# Patient Record
Sex: Male | Born: 1940 | Race: Black or African American | Hispanic: No | Marital: Single
Health system: Southern US, Community
[De-identification: ages and names within clinical notes are randomized; demographics above are authoritative.]

## PROBLEM LIST (undated history)

## (undated) DIAGNOSIS — F0390 Unspecified dementia without behavioral disturbance: Secondary | ICD-10-CM

## (undated) DIAGNOSIS — I10 Essential (primary) hypertension: Secondary | ICD-10-CM

---

## 2013-09-05 ENCOUNTER — Emergency Department (HOSPITAL_COMMUNITY)
Admission: EM | Admit: 2013-09-05 | Discharge: 2013-09-05 | Disposition: A | Discharge status: Home / Self Care | Payer: PRIVATE HEALTH INSURANCE | Attending: Emergency Medicine | Admitting: Emergency Medicine

## 2013-09-05 ENCOUNTER — Emergency Department (HOSPITAL_COMMUNITY): Payer: PRIVATE HEALTH INSURANCE

## 2013-09-05 ENCOUNTER — Encounter (HOSPITAL_COMMUNITY): Payer: Self-pay | Admitting: Emergency Medicine

## 2013-09-05 DIAGNOSIS — S31801A Laceration without foreign body of unspecified buttock, initial encounter: Secondary | ICD-10-CM

## 2013-09-05 DIAGNOSIS — Z79899 Other long term (current) drug therapy: Secondary | ICD-10-CM | POA: Insufficient documentation

## 2013-09-05 DIAGNOSIS — S31809A Unspecified open wound of unspecified buttock, initial encounter: Secondary | ICD-10-CM | POA: Insufficient documentation

## 2013-09-05 DIAGNOSIS — Z23 Encounter for immunization: Secondary | ICD-10-CM | POA: Insufficient documentation

## 2013-09-05 DIAGNOSIS — I10 Essential (primary) hypertension: Secondary | ICD-10-CM | POA: Insufficient documentation

## 2013-09-05 DIAGNOSIS — F0390 Unspecified dementia without behavioral disturbance: Secondary | ICD-10-CM | POA: Insufficient documentation

## 2013-09-05 HISTORY — DX: Unspecified dementia, unspecified severity, without behavioral disturbance, psychotic disturbance, mood disturbance, and anxiety: F03.90

## 2013-09-05 HISTORY — DX: Essential (primary) hypertension: I10

## 2013-09-05 MED ORDER — MIDAZOLAM HCL 2 MG/2ML IJ SOLN
INTRAMUSCULAR | Status: AC
  Filled 2013-09-05: qty 2

## 2013-09-05 MED ORDER — HALOPERIDOL LACTATE 5 MG/ML IJ SOLN
INTRAMUSCULAR | Status: AC
  Filled 2013-09-05: qty 1

## 2013-09-05 MED ORDER — MIDAZOLAM HCL 5 MG/5ML IJ SOLN
2.0000 mg | Freq: Once | INTRAMUSCULAR | Status: AC
  Administered 2013-09-05: 2 mg via INTRAMUSCULAR

## 2013-09-05 MED ORDER — HALOPERIDOL LACTATE 5 MG/ML IJ SOLN
5.0000 mg | Freq: Once | INTRAMUSCULAR | Status: AC
  Administered 2013-09-05: 5 mg via INTRAMUSCULAR

## 2013-09-05 MED ORDER — TETANUS-DIPHTH-ACELL PERTUSSIS 5-2.5-18.5 LF-MCG/0.5 IM SUSP
0.5000 mL | Freq: Once | INTRAMUSCULAR | Status: AC
Start: 2013-09-05 — End: 2013-09-05
  Administered 2013-09-05: 0.5 mL via INTRAMUSCULAR
  Filled 2013-09-05: qty 0.5

## 2013-09-05 NOTE — Discharge Instructions (Signed)
Laceration Care, Adult °A laceration is a cut or lesion that goes through all layers of the skin and into the tissue just beneath the skin. °TREATMENT  °Some lacerations may not require closure. Some lacerations may not be able to be closed due to an increased risk of infection. It is important to see your caregiver as soon as possible after an injury to minimize the risk of infection and maximize the opportunity for successful closure. °If closure is appropriate, pain medicines may be given, if needed. The wound will be cleaned to help prevent infection. Your caregiver will use stitches (sutures), staples, wound glue (adhesive), or skin adhesive strips to repair the laceration. These tools bring the skin edges together to allow for faster healing and a better cosmetic outcome. However, all wounds will heal with a scar. Once the wound has healed, scarring can be minimized by covering the wound with sunscreen during the day for 1 full year. °HOME CARE INSTRUCTIONS  °For sutures or staples: °· Keep the wound clean and dry. °· If you were given a bandage (dressing), you should change it at least once a day. Also, change the dressing if it becomes wet or dirty, or as directed by your caregiver. °· Wash the wound with soap and water 2 times a day. Rinse the wound off with water to remove all soap. Pat the wound dry with a clean towel. °· After cleaning, apply a thin layer of the antibiotic ointment as recommended by your caregiver. This will help prevent infection and keep the dressing from sticking. °· You may shower as usual after the first 24 hours. Do not soak the wound in water until the sutures are removed. °· Only take over-the-counter or prescription medicines for pain, discomfort, or fever as directed by your caregiver. °· Get your sutures or staples removed as directed by your caregiver. °For skin adhesive strips: °· Keep the wound clean and dry. °· Do not get the skin adhesive strips wet. You may bathe  carefully, using caution to keep the wound dry. °· If the wound gets wet, pat it dry with a clean towel. °· Skin adhesive strips will fall off on their own. You may trim the strips as the wound heals. Do not remove skin adhesive strips that are still stuck to the wound. They will fall off in time. °For wound adhesive: °· You may briefly wet your wound in the shower or bath. Do not soak or scrub the wound. Do not swim. Avoid periods of heavy perspiration until the skin adhesive has fallen off on its own. After showering or bathing, gently pat the wound dry with a clean towel. °· Do not apply liquid medicine, cream medicine, or ointment medicine to your wound while the skin adhesive is in place. This may loosen the film before your wound is healed. °· If a dressing is placed over the wound, be careful not to apply tape directly over the skin adhesive. This may cause the adhesive to be pulled off before the wound is healed. °· Avoid prolonged exposure to sunlight or tanning lamps while the skin adhesive is in place. Exposure to ultraviolet light in the first year will darken the scar. °· The skin adhesive will usually remain in place for 5 to 10 days, then naturally fall off the skin. Do not pick at the adhesive film. °You may need a tetanus shot if: °· You cannot remember when you had your last tetanus shot. °· You have never had a tetanus   shot. If you get a tetanus shot, your arm may swell, get red, and feel warm to the touch. This is common and not a problem. If you need a tetanus shot and you choose not to have one, there is a rare chance of getting tetanus. Sickness from tetanus can be serious. SEEK MEDICAL CARE IF:   You have redness, swelling, or increasing pain in the wound.  You see a red line that goes away from the wound.  You have yellowish-white fluid (pus) coming from the wound.  You have a fever.  You notice a bad smell coming from the wound or dressing.  Your wound breaks open before or  after sutures have been removed.  You notice something coming out of the wound such as wood or glass.  Your wound is on your hand or foot and you cannot move a finger or toe. SEEK IMMEDIATE MEDICAL CARE IF:   Your pain is not controlled with prescribed medicine.  You have severe swelling around the wound causing pain and numbness or a change in color in your arm, hand, leg, or foot.  Your wound splits open and starts bleeding.  You have worsening numbness, weakness, or loss of function of any joint around or beyond the wound.  You develop painful lumps near the wound or on the skin anywhere on your body. MAKE SURE YOU:   Understand these instructions.  Will watch your condition.  Will get help right away if you are not doing well or get worse. Document Released: 06/09/2005 Document Revised: 09/01/2011 Document Reviewed: 12/03/2010 [facility] Patient Information 2014 [place], [place]. Staple Wound Closure Staples are used to help a wound heal faster by holding the edges of the wound together. HOME CARE  Keep the area around the staples clean and dry.  Rest and raise (elevate) the injured part above the level of your heart.  See your doctor for a follow-up check of the wound.  See your doctor to have the staples removed.  Clean the wound daily with water.  Do not soak the wound in water for long periods of time.  Let air reach the wound as it heals. GET HELP RIGHT AWAY IF:   You have redness or puffiness around the wound.  You have a red line going away from the wound.  You have more pain or tenderness.  You have yellowish-white fluid (pus) coming from the wound.  Your wound does not stay together after the staples have been taken out.  You see something coming out of the wound, such as wood or glass.  You have problems moving the injured area.  You have a fever or lasting symptoms for more than 2-3 days.  You have a fever and your symptoms suddenly get  worse. MAKE SURE YOU:   Understand these instructions.  Will watch this condition.  Will get help right away if you are not doing well or get worse. Document Released: 03/18/2008 Document Revised: 03/03/2012 Document Reviewed: 12/21/2011 [facility] Patient Information 2014 [place], [place].
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
## 2013-09-05 NOTE — ED Notes (Signed)
Bed: WA02 Expected date:  Expected time:  Means of arrival:  Comments: EMS fall lac on [place]

## 2013-09-05 NOTE — Progress Notes (Signed)
CSW engaged with the patient and he was unable to provide accurate information about his living arrangements.  Documentation [facility] and the patient denies.  CSW confirmed with the RN that the patient is [facility] for [facility].      Maryelizabeth Rowanressa Emerson Schreifels, MSW, SidneyLCSWA, 09/05/2013  Evening Clinical Social Worker [phone]
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
## 2013-09-16 ENCOUNTER — Encounter (HOSPITAL_COMMUNITY): Payer: Self-pay | Admitting: Emergency Medicine

## 2013-09-16 ENCOUNTER — Emergency Department (HOSPITAL_COMMUNITY)
Admission: EM | Admit: 2013-09-16 | Discharge: 2013-09-16 | Disposition: A | Discharge status: Home / Self Care | Payer: PRIVATE HEALTH INSURANCE | Attending: Emergency Medicine | Admitting: Emergency Medicine

## 2013-09-16 DIAGNOSIS — Z4802 Encounter for removal of sutures: Secondary | ICD-10-CM | POA: Insufficient documentation

## 2013-09-16 DIAGNOSIS — I10 Essential (primary) hypertension: Secondary | ICD-10-CM | POA: Insufficient documentation

## 2013-09-16 DIAGNOSIS — Z79899 Other long term (current) drug therapy: Secondary | ICD-10-CM | POA: Insufficient documentation

## 2013-09-16 DIAGNOSIS — F0390 Unspecified dementia without behavioral disturbance: Secondary | ICD-10-CM | POA: Insufficient documentation

## 2013-09-16 NOTE — Discharge Instructions (Signed)
Read the information below.  You may return to the Emergency Department at any time for worsening condition or any new symptoms that concern you.  Keep the cuts clean and covered with a thin layer of antibiotic ointment and a bandage until they are healed.  Change the bandage twice daily.  If you develop redness, swelling, pus draining from the wound, or fevers greater than 100.4, return to the ER immediately for a recheck.     Staple Removal Care After The staples used to close your skin have been removed. The wound needs continued care so it can heal completely and without problems. The care described here will need to be done for another 5-10 days unless your caregiver advises otherwise.  HOME CARE INSTRUCTIONS   Keep wound site dry and clean.  If skin adhesive strips were applied after the staples were removed, they will begin to peel off in a few days. If they remain after fourteen days, they may be peeled off and discarded.  If you still have a dressing, change it at least once a day or as instructed by your caregiver. If the bandage sticks, soak it off with warm water. Pat dry with a clean towel. Look for signs of infection (see below).  Reapply cream or ointment according to your caregiver's instruction. This will help prevent infection and keep the bandage from sticking. Use of a non-stick material over the wound and under the dressing or wrap will also help keep the bandage from sticking.  If the bandage becomes wet, dirty or develops a foul smell, change it as soon as possible.  New scars become sunburned easily. Use sunscreens with protection factor (SPF) of at least 15 when out in the sun.  Only take over-the-counter or prescription medicines for pain, discomfort or fever as directed by your caregiver. SEEK IMMEDIATE MEDICAL CARE IF:   There is redness, swelling or increasing pain in the wound.  Pus is coming from the wound.  An unexplained oral temperature above 102 F (38.9  C) develops.  You notice a foul smell coming from the wound or dressing.  There is a breaking open of the suture line (edges not staying together) of the wound edges after staples have been removed. Document Released: 05/22/2008 Document Revised: 09/01/2011 Document Reviewed: 05/22/2008 [facility] Patient Information 2014 [place]ExitCare, [place]LL[place].

## 2013-09-16 NOTE — ED Provider Notes (Signed)
  Medical screening examination/treatment/procedure(s) were performed by non-physician practitioner and as supervising physician I was immediately available for consultation/collaboration.   EKG Interpretation None         Gerhard Munchobert Caleesi Kohl, MD 09/16/13 [phone]
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
## 2014-12-02 ENCOUNTER — Emergency Department (HOSPITAL_COMMUNITY)
Admission: EM | Admit: 2014-12-02 | Discharge: 2014-12-02 | Disposition: A | Discharge status: Home / Self Care | Payer: PRIVATE HEALTH INSURANCE | Attending: Emergency Medicine | Admitting: Emergency Medicine

## 2014-12-02 ENCOUNTER — Encounter (HOSPITAL_COMMUNITY): Payer: Self-pay | Admitting: Emergency Medicine

## 2014-12-02 ENCOUNTER — Emergency Department (HOSPITAL_COMMUNITY): Payer: PRIVATE HEALTH INSURANCE

## 2014-12-02 DIAGNOSIS — S4991XA Unspecified injury of right shoulder and upper arm, initial encounter: Secondary | ICD-10-CM | POA: Diagnosis present

## 2014-12-02 DIAGNOSIS — I10 Essential (primary) hypertension: Secondary | ICD-10-CM | POA: Insufficient documentation

## 2014-12-02 DIAGNOSIS — Y9389 Activity, other specified: Secondary | ICD-10-CM | POA: Insufficient documentation

## 2014-12-02 DIAGNOSIS — F0391 Unspecified dementia with behavioral disturbance: Secondary | ICD-10-CM | POA: Diagnosis not present

## 2014-12-02 DIAGNOSIS — M7501 Adhesive capsulitis of right shoulder: Secondary | ICD-10-CM | POA: Insufficient documentation

## 2014-12-02 DIAGNOSIS — Z79899 Other long term (current) drug therapy: Secondary | ICD-10-CM | POA: Insufficient documentation

## 2014-12-02 DIAGNOSIS — Y998 Other external cause status: Secondary | ICD-10-CM | POA: Diagnosis not present

## 2014-12-02 DIAGNOSIS — Z791 Long term (current) use of non-steroidal anti-inflammatories (NSAID): Secondary | ICD-10-CM | POA: Insufficient documentation

## 2014-12-02 DIAGNOSIS — W1839XA Other fall on same level, initial encounter: Secondary | ICD-10-CM | POA: Diagnosis not present

## 2014-12-02 DIAGNOSIS — Y92128 Other place in nursing home as the place of occurrence of the external cause: Secondary | ICD-10-CM | POA: Diagnosis not present

## 2014-12-02 DIAGNOSIS — W19XXXA Unspecified fall, initial encounter: Secondary | ICD-10-CM

## 2014-12-02 NOTE — Discharge Instructions (Signed)
If you were given medicines take as directed.  If you are on coumadin or contraceptives realize their levels and effectiveness is altered by many different medicines.  If you have any reaction (rash, tongues swelling, other) to the medicines stop taking and see a physician.    If your blood pressure was elevated in the ER make sure you follow up for management with a primary doctor or return for chest pain, shortness of breath or stroke symptoms.  Please follow up as directed and return to the ER or see a physician for new or worsening symptoms.  Thank you. Filed Vitals:   12/02/14 1314  BP: 124/63  Pulse: 98  Temp: 98.4 F (36.9 C)  TempSrc: Oral  Resp: 16  SpO2: 100%

## 2014-12-02 NOTE — ED Notes (Signed)
Patient here from [facility] with complaints of fall 3 days ago. Generalized pain. Blind. Dementia.
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
## 2014-12-02 NOTE — ED Notes (Signed)
Patient combative, yelling "leave me alone"

## 2014-12-02 NOTE — ED Provider Notes (Signed)
CSN: [id number]     Arrival date & time 12/02/14  1304 History   First MD Initiated Contact with Patient 12/02/14 1501     Chief Complaint  Patient presents with  . Fall     (Consider location/radiation/quality/duration/timing/severity/associated sxs/prior Treatment) HPI Comments: 74 year old male unknown smoking history, dementia, high blood pressure presents with concern for right arm injury. Patient had fall witnessed 3 days prior no head injury and intermittently has complained of pain nonfocal. However favoring right shoulder humerus region. No other complaints. The nursing home, patient had baseline with normal intermittent agitation. No blood thinners. Unable to obtain any history from the patient  Patient is a 74 y.o. male presenting with fall. The history is provided by the patient.  Fall    Past Medical History  Diagnosis Date  . Dementia   . Hypertension    History reviewed. No pertinent past surgical history. History reviewed. No pertinent family history. History  Substance Use Topics  . Smoking status: Unknown If Ever Smoked  . Smokeless tobacco: Not on file  . Alcohol Use: No    Review of Systems  Unable to perform ROS: Dementia      Allergies  Review of patient's allergies indicates no known allergies.  Home Medications   Prior to Admission medications   Medication Sig Start Date End Date Taking? Authorizing Provider  brimonidine (ALPHAGAN) 0.2 % ophthalmic solution Place 1 drop into both eyes 2 (two) times daily.   Yes Historical Provider, MD  dorzolamide-timolol (COSOPT) 22.3-6.8 MG/ML ophthalmic solution Place 1 drop into both eyes 2 (two) times daily.   Yes Historical Provider, MD  haloperidol (HALDOL) 1 MG tablet Take 1 mg by mouth 2 (two) times daily.   Yes Historical Provider, MD  ketorolac (ACULAR) 0.5 % ophthalmic solution Place 1 drop into both eyes 3 (three) times daily.   Yes Historical Provider, MD  latanoprost (XALATAN) 0.005 % ophthalmic  solution Place 1 drop into both eyes at bedtime.   Yes Historical Provider, MD  lisinopril (PRINIVIL,ZESTRIL) 20 MG tablet Take 20 mg by mouth every morning.    Yes Historical Provider, MD  loperamide (IMODIUM) 2 MG capsule Take 4 mg by mouth as needed for diarrhea or loose stools.   Yes Historical Provider, MD  magnesium hydroxide (MILK OF MAGNESIA) 400 MG/5ML suspension Take 30 mLs by mouth daily as needed for mild constipation.   Yes Historical Provider, MD  Multiple Vitamin (MULTIVITAMIN WITH MINERALS) TABS tablet Take 1 tablet by mouth every morning.    Yes Historical Provider, MD  NONFORMULARY OR COMPOUNDED ITEM Place 1 mg onto the skin every morning. Lorazepam gel /mL apply to wrist every morning.   Yes Historical Provider, MD  NONFORMULARY OR COMPOUNDED ITEM Place 1 mg onto the skin 2 (two) times daily as needed (anxiety/agitation). Lorazepam gel /mL apply to wrist twice daily as needed for anxiety/agitation.   Yes Historical Provider, MD  Nutritional Supplements (NUTRITIONAL SHAKE PO) Take 1 Can by mouth 3 (three) times daily. "Mighty Shakes"   Yes Historical Provider, MD  sertraline (ZOLOFT) 50 MG tablet Take 50 mg by mouth every morning.    Yes Historical Provider, MD   BP 124/63 mmHg  Pulse 98  Temp(Src) 98.4 F (36.9 C) (Oral)  Resp 16  SpO2 100% Physical Exam  Constitutional: He appears well-developed and well-nourished.  HENT:  Head: Normocephalic and atraumatic.  Eyes: Pupils are equal, round, and reactive to light.  Neck: Normal range of motion. Neck supple.  Cardiovascular:  Normal rate.   Pulmonary/Chest: Effort normal.  Musculoskeletal:  Very difficult exam as patient says leave him alone and is agitated. Patient is favoring the right arm and shoulder, grossly equal movement of other extremities and joints. Full range of motion head neck on his own.  Neurological: He is alert. GCS eye subscore is 4. GCS verbal subscore is 4. GCS motor subscore is 5.  Patient will  set up in bed, agitated, moves legs equally, hold both arms and shoulders in front of abdomen, very difficult exam, patient is blind at baseline.  Skin: Skin is warm.  Psychiatric:  Agitation mild, dementia  Nursing note and vitals reviewed.   ED Course  Procedures (including critical care time) Labs Review Labs Reviewed - No data to display  Imaging Review Dg Shoulder Right  12/02/2014   CLINICAL DATA:  Fall 3 days ago with shoulder pain  EXAM: RIGHT SHOULDER - 2+ VIEW  COMPARISON:  None.  FINDINGS: Postsurgical changes are noted with medullary rods within the proximal humerus. Significant dystrophic calcification suggestive of a frozen joint is noted. No definitive fracture is seen.  IMPRESSION: Changes suggestive of a frozen joint.   Electronically Signed   By: Alcide Clever M.D.   On: 12/02/2014 16:15   Dg Forearm Right  12/02/2014   CLINICAL DATA:  Fall 3 days ago with forearm pain, initial encounter  EXAM: RIGHT FOREARM - 2 VIEW  COMPARISON:  None.  FINDINGS: No acute fracture or dislocation is noted. Some ligamentous calcification is noted about the elbow joint. No gross soft tissue abnormality is seen.  IMPRESSION: No acute abnormality noted.   Electronically Signed   By: Alcide Clever M.D.   On: 12/02/2014 16:17   Dg Humerus Right  12/02/2014   CLINICAL DATA:  Fall 3 days ago with pain, initial encounter  EXAM: RIGHT HUMERUS - 2+ VIEW  COMPARISON:  None.  FINDINGS: There again noted postsurgical changes in the proximal humerus with dystrophic bone formation suggestive of frozen joint and fusion of the glenohumeral articulation. Old rib fractures are noted on the right with healing. No acute abnormality is seen.  IMPRESSION: Chronic changes about the shoulder joint without acute abnormality.   Electronically Signed   By: Alcide Clever M.D.   On: 12/02/2014 16:16     EKG Interpretation None      MDM   Final diagnoses:  Frozen shoulder syndrome, right  Fall, initial encounter   Dementia, with behavioral disturbance    Patient presents 3 days after witnessed fall, low risk fall per report patient at baseline per report and we spoke with the nursing home to clarify. Security and nursing staff use to try to assist patient/convince to obtain x-ray of the right arm. Concern for possible humerus fracture however unable to do detailed exam.  With significant support from staff we were able to obtain x-rays, x-rays reviewed no acute fracture, radiology concern for frozen shoulder, clinically patient not moving the right shoulder very well however difficult exam due to dementia. No swelling or fever.   Medications - No data to display  Filed Vitals:   12/02/14 1314  BP: 124/63  Pulse: 98  Temp: 98.4 F (36.9 C)  TempSrc: Oral  Resp: 16  SpO2: 100%    Final diagnoses:  Frozen shoulder syndrome, right  Fall, initial encounter  Dementia, with behavioral disturbance      Blane Ohara, MD 12/02/14 1625

## 2014-12-13 ENCOUNTER — Emergency Department (HOSPITAL_COMMUNITY): Payer: Medicare Other

## 2014-12-13 ENCOUNTER — Encounter (HOSPITAL_COMMUNITY): Payer: Self-pay | Admitting: Emergency Medicine

## 2014-12-13 ENCOUNTER — Emergency Department (HOSPITAL_COMMUNITY)
Admission: EM | Admit: 2014-12-13 | Discharge: 2014-12-22 | Disposition: E | Payer: Medicare Other | Attending: Emergency Medicine | Admitting: Emergency Medicine

## 2014-12-13 DIAGNOSIS — E860 Dehydration: Secondary | ICD-10-CM | POA: Diagnosis not present

## 2014-12-13 DIAGNOSIS — I469 Cardiac arrest, cause unspecified: Secondary | ICD-10-CM

## 2014-12-13 DIAGNOSIS — N289 Disorder of kidney and ureter, unspecified: Secondary | ICD-10-CM | POA: Diagnosis not present

## 2014-12-13 DIAGNOSIS — F0390 Unspecified dementia without behavioral disturbance: Secondary | ICD-10-CM | POA: Diagnosis not present

## 2014-12-13 DIAGNOSIS — J690 Pneumonitis due to inhalation of food and vomit: Secondary | ICD-10-CM | POA: Insufficient documentation

## 2014-12-13 DIAGNOSIS — R6520 Severe sepsis without septic shock: Secondary | ICD-10-CM

## 2014-12-13 DIAGNOSIS — Z79899 Other long term (current) drug therapy: Secondary | ICD-10-CM | POA: Diagnosis not present

## 2014-12-13 DIAGNOSIS — R000 Tachycardia, unspecified: Secondary | ICD-10-CM | POA: Insufficient documentation

## 2014-12-13 DIAGNOSIS — R6521 Severe sepsis with septic shock: Secondary | ICD-10-CM

## 2014-12-13 DIAGNOSIS — R569 Unspecified convulsions: Secondary | ICD-10-CM | POA: Diagnosis not present

## 2014-12-13 DIAGNOSIS — I10 Essential (primary) hypertension: Secondary | ICD-10-CM | POA: Insufficient documentation

## 2014-12-13 DIAGNOSIS — A419 Sepsis, unspecified organism: Secondary | ICD-10-CM | POA: Insufficient documentation

## 2014-12-13 DIAGNOSIS — N19 Unspecified kidney failure: Secondary | ICD-10-CM

## 2014-12-13 DIAGNOSIS — Z791 Long term (current) use of non-steroidal anti-inflammatories (NSAID): Secondary | ICD-10-CM | POA: Diagnosis not present

## 2014-12-13 DIAGNOSIS — R1110 Vomiting, unspecified: Secondary | ICD-10-CM | POA: Diagnosis present

## 2014-12-13 DIAGNOSIS — T17908A Unspecified foreign body in respiratory tract, part unspecified causing other injury, initial encounter: Secondary | ICD-10-CM | POA: Insufficient documentation

## 2014-12-13 LAB — I-STAT CHEM 8, ED
BUN: 68 mg/dL — ABNORMAL HIGH (ref 6–20)
CALCIUM ION: 1.06 mmol/L — AB (ref 1.13–1.30)
Chloride: 105 mmol/L (ref 101–111)
Creatinine, Ser: 3.5 mg/dL — ABNORMAL HIGH (ref 0.61–1.24)
GLUCOSE: 148 mg/dL — AB (ref 65–99)
HCT: 46 % (ref 39.0–52.0)
HEMOGLOBIN: 15.6 g/dL (ref 13.0–17.0)
Potassium: 4.5 mmol/L (ref 3.5–5.1)
Sodium: 143 mmol/L (ref 135–145)
TCO2: 22 mmol/L (ref 0–100)

## 2014-12-13 LAB — URINALYSIS, ROUTINE W REFLEX MICROSCOPIC
Glucose, UA: NEGATIVE mg/dL
Hgb urine dipstick: NEGATIVE
Ketones, ur: NEGATIVE mg/dL
LEUKOCYTES UA: NEGATIVE
NITRITE: NEGATIVE
PH: 5.5 (ref 5.0–8.0)
Protein, ur: NEGATIVE mg/dL
Specific Gravity, Urine: 1.026 (ref 1.005–1.030)
UROBILINOGEN UA: 0.2 mg/dL (ref 0.0–1.0)

## 2014-12-13 LAB — I-STAT CG4 LACTIC ACID, ED: LACTIC ACID, VENOUS: 10.62 mmol/L — AB (ref 0.5–2.0)

## 2014-12-13 LAB — I-STAT TROPONIN, ED: TROPONIN I, POC: 0.05 ng/mL (ref 0.00–0.08)

## 2014-12-13 MED ORDER — NOREPINEPHRINE BITARTRATE 1 MG/ML IV SOLN
2.0000 ug/min | INTRAVENOUS | Status: DC
  Filled 2014-12-13: qty 4

## 2014-12-13 MED ORDER — SODIUM CHLORIDE 0.9 % IV BOLUS (SEPSIS): 1000.0000 mL | Freq: Once | INTRAVENOUS | Status: DC

## 2014-12-13 MED ORDER — HALOPERIDOL LACTATE 5 MG/ML IJ SOLN
INTRAMUSCULAR | Status: AC
  Filled 2014-12-13: qty 1

## 2014-12-13 MED ORDER — VANCOMYCIN HCL 500 MG IV SOLR: 500.0000 mg | INTRAVENOUS | Status: DC

## 2014-12-13 MED ORDER — VANCOMYCIN HCL IN DEXTROSE 1-5 GM/200ML-% IV SOLN: 1000.0000 mg | INTRAVENOUS | Status: DC

## 2014-12-13 MED ORDER — ATROPINE SULFATE 1 MG/ML IJ SOLN
INTRAMUSCULAR | Status: AC | PRN
  Administered 2014-12-13: 1 mg via INTRAVENOUS

## 2014-12-13 MED ORDER — EPINEPHRINE HCL 0.1 MG/ML IJ SOSY
PREFILLED_SYRINGE | INTRAMUSCULAR | Status: AC | PRN
  Administered 2014-12-13 (×2): 1 mg via INTRAVENOUS

## 2014-12-13 MED ORDER — SODIUM BICARBONATE 8.4 % IV SOLN
INTRAVENOUS | Status: AC | PRN
  Administered 2014-12-13: 50 meq via INTRAVENOUS

## 2014-12-13 MED ORDER — PIPERACILLIN-TAZOBACTAM 3.375 G IVPB 30 MIN: 3.3750 g | INTRAVENOUS | Status: DC

## 2014-12-13 MED ORDER — PIPERACILLIN-TAZOBACTAM IN DEX 2-0.25 GM/50ML IV SOLN
2.2500 g | Freq: Four times a day (QID) | INTRAVENOUS | Status: DC
  Filled 2014-12-13: qty 50

## 2014-12-13 MED ORDER — CALCIUM CHLORIDE 10 % IV SOLN
INTRAVENOUS | Status: AC | PRN
  Administered 2014-12-13: 1 g via INTRAVENOUS

## 2014-12-13 MED ORDER — HALOPERIDOL LACTATE 5 MG/ML IJ SOLN
5.0000 mg | Freq: Once | INTRAMUSCULAR | Status: AC
  Administered 2014-12-13: 5 mg via INTRAMUSCULAR

## 2014-12-13 MED ORDER — DEXTROSE 50 % IV SOLN
INTRAVENOUS | Status: AC | PRN
  Administered 2014-12-13: 50 mL via INTRAVENOUS

## 2014-12-13 MED FILL — Medication: Qty: 1 | Status: AC

## 2014-12-22 NOTE — ED Notes (Addendum)
Pt moaning at present time and moving extremities freely (right arm contracted). As MD at bedside pt crossed feet and hands for comfort.

## 2014-12-22 NOTE — ED Notes (Signed)
Pt armband from visit 6/11 on right arm on arrival.

## 2014-12-22 NOTE — Procedures (Signed)
PROCEDURE NOTE: CVL PLACEMENT  INDICATION:    Monitoring of central venous pressures and/or administration of medications optimally administered in central vein  CONSENT:   Performed emergently  PROCEDURE  Sterile technique was used including antiseptics, cap, gloves, gown, hand hygiene, mask and full body sheet.  Skin prep: Chlorhexidine; local anesthetic administered  A triple lumen catheter was placed in the L [place] vein using the Seldinger technique.   EVALUATION:  Blood flow good in all three ports Complications: No apparent complications  Patient tolerated the procedure well.    Billy Fischer, MD [facility] service Mobile [phone]

## 2014-12-22 NOTE — ED Notes (Signed)
Unable to achieve IV access, awaiting pt to become relaxed before attempt

## 2014-12-22 NOTE — ED Notes (Addendum)
Tiffany able to draw enough blood sample for ISTAT; this nurse unsuccessful IV start x2. Rubin Payor reports will attempt [place] IV.  At present time pt verbal speaking with staff.

## 2014-12-22 NOTE — ED Notes (Signed)
MD at bedside. 

## 2014-12-22 NOTE — ED Notes (Signed)
Shantae from facility reports pt "vomitted from nose, stiff, not able to walk" at 0830 this morning. Genevie Cheshire continues to report pt not baseline since yesterday; pt "normally walking, combative, and talking" but disoriented x4 per normal. Shantae denies pt hx of seizures.

## 2014-12-22 NOTE — ED Notes (Addendum)
Upon initial contact with pt pt with mouth open, does not respond to sternal rub, not respond to verbal. Pt breaths 16-20 breaths minute; unlabored.

## 2014-12-22 NOTE — Procedures (Signed)
Oral Intubation Procedure Note   Procedure: Intubation Indications: cardiorespiratory arrest Consent: Unable to obtain consent because of altered level of consciousness. Time Out: Verified patient identification, verified procedure, site/side was marked, verified correct patient position, special equipment/implants available, medications/allergies/relevent history reviewed, required imaging and test results available.   Pre-meds: none  Neuromuscular blockade: None  Laryngoscope: #4 MAC  Visualization: cords fully visualized  ETT: 8.0 ETT passed on first attempt and secured @ 25 cm at upper gums  Findings: feculent vomitus in airway    Billy Fischer, MD ; [facility] service Mobile [phone].  After 5:30 PM or weekends, call [phone]

## 2014-12-22 NOTE — Procedures (Signed)
ACLS NOTE:  We were asked to evaluate and perform initial resuscitation on this 74 yo M with severe dementia who was brought to ED for AMS and was found to have suspected PNA, septic shock, AFRVR, lactic acidosis, AKI. The ER team was unable to establish IV access and obtain initial blood specimens. Upon our arrival, he was stuporous and hypotensive necessitating emergent CVL placemnt. In the course of placement of a L Mellott CVL, he developed WCT and subsequent cardiopulmonary arrest. After removal of the sterile drape, his face was covered with feculent vomitus and approx 250 cc of same was aspirated from his oropharynx. Chest compressions were initiated. He was successfully intubated on the first attempt. Ultimately ACLS efforts included epi X 2, atropine, Ca++, HCO3, and approx 10-15 mins chest compressions. His rhythms included asystole, bradycardic wide complex rhythm and a wide complex with rate of approx 80/min. Through all of these efforts, his pulse was never restored. Given his poor baseline state of health, severity of current illness and its anticipated course and poor prognosis of asystole and PEA, ACLS efforts were discontinued. All care providers agreed with this decision.  He was pronounced dead shortly thereafter.  Cause of death: Severe sepsis/septic shock with severe lactic acidosis Aspiration PNA AKI   Billy Fischer, MD ; [facility] service Mobile [phone].  After 5:30 PM or weekends, call [phone]

## 2014-12-22 NOTE — ED Notes (Addendum)
Pickering unsuccessful [place] IV attempt; reports allow haldol to work prior to another attempt; pt combative/aggitated. IV administration orders to be complete with IV access.  Pt pulls oxygen tubing from nose; refuses to wear; MD at bedside.

## 2014-12-22 NOTE — Progress Notes (Signed)
ANTIBIOTIC CONSULT NOTE - INITIAL  Pharmacy Consult for Vancomycin and Zosyn Indication: rule out sepsis  No Known Allergies  Patient Measurements:   Height: 6' Weight: 50 kg  Vital Signs: Temp: 98.9 F (37.2 C) (06/22 1044) Temp Source: Oral (06/22 1044) BP: 127/92 mmHg (06/22 1135) Pulse Rate: 144 (06/22 1135) Intake/Output from previous day:   Intake/Output from this shift:    Labs:  Recent Labs  December 18, 2014 1041  HGB 15.6  CREATININE 3.50*   CrCl cannot be calculated (Unknown ideal weight.). No results for input(s): VANCOTROUGH, VANCOPEAK, VANCORANDOM, GENTTROUGH, GENTPEAK, GENTRANDOM, TOBRATROUGH, TOBRAPEAK, TOBRARND, AMIKACINPEAK, AMIKACINTROU, AMIKACIN in the last 72 hours.   Microbiology: No results found for this or any previous visit (from the past 720 hour(s)).  Medical History: Past Medical History  Diagnosis Date  . Dementia   . Hypertension     Medications:  Scheduled:   Infusions:  . piperacillin-tazobactam    . sodium chloride     PRN:   Assessment: 74 yo male NH resident presents with N/V. CXR concerning for PNA. Pharmacy consulted to dose vancomycin and zosyn.  6/22 >> Vancomycin >> 6/22 >> Zosyn >>  Afebrile SCr 3.5, CrCl 12 ml/min Lactate: 10.62  6/22 blood x 2: sent  Goal of Therapy:  Vancomycin trough level 15-20 mcg/ml  Zosyn dose appropriate for indication and renal function  Plan:   Vancomycin 1g IV x 1 now, then 500mg  IV q48h starting 6/24  Zosyn 3.375g IV x 1, then 2.25g IV q6h Follow up renal function & cultures, clinical course  Loralee Pacas, PharmD, BCPS Pager: [phone] 18-Dec-2014,11:53 AM

## 2014-12-22 NOTE — ED Provider Notes (Signed)
CSN: [id number]     Arrival date & time 2015-01-10  1062 History   First MD Initiated Contact with Patient 2015-01-10 [phone]     No chief complaint on file.   Level V caveat due to altered mental status. (Consider location/radiation/quality/duration/timing/severity/associated sxs/prior Treatment) The history is provided by the patient and the EMS personnel.   patient was brought in from the nursing home. Reportedly had had vomiting and maybe some diarrhea. Found to be tachycardic. EMS and fire stated they saw seizure activity. There was no report of previous seizure disorder. Patient initially was somewhat stiff and then had relaxed some. Reported baseline is alert but oriented 0.  Past Medical History  Diagnosis Date  . Dementia   . Hypertension    History reviewed. No pertinent past surgical history. History reviewed. No pertinent family history. History  Substance Use Topics  . Smoking status: Unknown If Ever Smoked  . Smokeless tobacco: Not on file  . Alcohol Use: No    Review of Systems  Unable to perform ROS     Allergies  Review of patient's allergies indicates no known allergies.  Home Medications   Prior to Admission medications   Medication Sig Start Date End Date Taking? Authorizing Provider  brimonidine (ALPHAGAN) 0.2 % ophthalmic solution Place 1 drop into both eyes 2 (two) times daily.   Yes Historical Provider, MD  dorzolamide-timolol (COSOPT) 22.3-6.8 MG/ML ophthalmic solution Place 1 drop into both eyes 2 (two) times daily.   Yes Historical Provider, MD  haloperidol (HALDOL) 1 MG tablet Take 1 mg by mouth 2 (two) times daily.   Yes Historical Provider, MD  ketorolac (ACULAR) 0.5 % ophthalmic solution Place 1 drop into both eyes 3 (three) times daily.   Yes Historical Provider, MD  latanoprost (XALATAN) 0.005 % ophthalmic solution Place 1 drop into both eyes at bedtime.   Yes Historical Provider, MD  lisinopril (PRINIVIL,ZESTRIL) 20 MG tablet Take 20 mg by mouth  every morning.    Yes Historical Provider, MD  loperamide (IMODIUM) 2 MG capsule Take 4 mg by mouth as needed for diarrhea or loose stools.   Yes Historical Provider, MD  magnesium hydroxide (MILK OF MAGNESIA) 400 MG/5ML suspension Take 30 mLs by mouth daily as needed for mild constipation.   Yes Historical Provider, MD  Multiple Vitamin (MULTIVITAMIN WITH MINERALS) TABS tablet Take 1 tablet by mouth every morning.    Yes Historical Provider, MD  NONFORMULARY OR COMPOUNDED ITEM Place 1 mg onto the skin every morning. Lorazepam gel 1mg /mL apply to wrist every morning.   Yes Historical Provider, MD  NONFORMULARY OR COMPOUNDED ITEM Place 1 mg onto the skin 2 (two) times daily as needed (anxiety/agitation). Lorazepam gel 1mg /mL apply to wrist twice daily as needed for anxiety/agitation.   Yes Historical Provider, MD  Nutritional Supplements (NUTRITIONAL SHAKE PO) Take 1 Can by mouth 3 (three) times daily. "Mighty Shakes"   Yes Historical Provider, MD  sertraline (ZOLOFT) 50 MG tablet Take 50 mg by mouth every morning.    Yes Historical Provider, MD   BP 127/92 mmHg  Pulse 144  Temp(Src) 98.9 F (37.2 C) (Oral)  Resp 24  Ht 6' (1.829 m)  Wt 115 lb (52.164 kg)  BMI 15.59 kg/m2  SpO2 99% Physical Exam  Constitutional: He appears well-developed.  HENT:  Head: Atraumatic.  Mucous membranes are dry  Eyes: Pupils are equal, round, and reactive to light.  Cardiovascular:  Tachycardia  Pulmonary/Chest:  Transmitted upper airway sounds.  Abdominal: There  is no tenderness.  Musculoskeletal: He exhibits no edema.  Neurological:  Initially unresponsive, but not allow it to be depressed replaced in his mouth. Clonus in bilateral lower extremities. He would withdraw from pain in his upper extremities. Breathing spontaneously. He later woke up more to what may have been his baseline. He was awake and very confused.  Skin: Skin is warm.  Vitals reviewed.   ED Course  Procedures (including critical  care time) Labs Review Labs Reviewed  URINALYSIS, ROUTINE W REFLEX MICROSCOPIC (NOT AT [facility]) - Abnormal; Notable for the following:    Color, Urine AMBER (*)    APPearance CLOUDY (*)    Bilirubin Urine SMALL (*)    All other components within normal limits  I-STAT CHEM 8, ED - Abnormal; Notable for the following:    BUN 68 (*)    Creatinine, Ser 3.50 (*)    Glucose, Bld 148 (*)    Calcium, Ion 1.06 (*)    All other components within normal limits  I-STAT CG4 LACTIC ACID, ED - Abnormal; Notable for the following:    Lactic Acid, Venous 10.62 (*)    All other components within normal limits  CULTURE, BLOOD (ROUTINE X 2)  CULTURE, BLOOD (ROUTINE X 2)  COMPREHENSIVE METABOLIC PANEL  CBC WITH DIFFERENTIAL/PLATELET  ETHANOL  TROPONIN I  I-STAT TROPOININ, ED    Imaging Review Ct Head Wo Contrast  12/18/14   CLINICAL DATA:  Altered mental status.  Seizure  EXAM: CT HEAD WITHOUT CONTRAST  TECHNIQUE: Contiguous axial images were obtained from the base of the skull through the vertex without intravenous contrast.  COMPARISON:  CT head 02/29/2012  FINDINGS: Moderate to advanced atrophy has progressed in the interval. Chronic microvascular ischemic changes throughout the white matter.  Negative for acute infarct. Negative for hemorrhage or mass. No edema or shift of the midline structures.  Calvarium intact.  IMPRESSION: Progressive generalized atrophy. Chronic microvascular ischemic change  No acute abnormality.   Electronically Signed   By: Marlan Palau M.D.   On: 2014/12/18 10:46   Dg Chest Portable 1 View  12/18/2014   CLINICAL DATA:  Initial encounter for vomiting since 08/30 this morning.  EXAM: PORTABLE CHEST - 1 VIEW  COMPARISON:  02/29/2012.  FINDINGS: 1058 hrs. Patchy bilateral airspace disease is new in the interval, left greater than right. The cardio pericardial silhouette is enlarged. Chronic deformity in the right shoulder noted. Telemetry leads overlie the chest. IVC filter  overlies the right upper quadrant of the abdomen.  IMPRESSION: Patchy bilateral airspace disease, left greater than right. Multifocal pneumonia would be a consideration.   Electronically Signed   By: Kennith Center M.D.   On: 2014/12/18 11:12     EKG Interpretation   Date/Time:  Wednesday 2014-12-18 10:02:36 EDT Ventricular Rate:  133 PR Interval:  137 QRS Duration: 103 QT Interval:  321 QTC Calculation: 477 R Axis:   77 Text Interpretation:  Sinus tachycardia Borderline repolarization  abnormality Borderline prolonged QT interval Confirmed by Rubin Payor  MD,  Harrold Donath [phone]) on Dec 18, 2014 10:08:16 AM      MDM   Final diagnoses:  Cardiac arrest  Severe sepsis  Aspiration into airway, initial encounter  Renal failure  Dehydration  New onset seizure   patient presented with altered mental status and apparent new onset seizure. He is not febrile. Patient's mental status improved somewhat and there was question of whether he was postictal as he altered mental status. No previous seizure history. Initial blood  pressure was okay but then became lower. He was very difficult IV access. Right shoulders frozen and the veins on his left arm would blow. IV placement was slowed down. Patient became more combative which may be his baseline and required IV Haldol for coming to be able to get more IV access. White count was not able to be done because blood was not obtained initially for that. However his creatinine was 3.5 with a normal bicarbonate and his lactic acid was 10.6. Chest x-ray showed bilateral opacities and sepsis antibodies were ordered. Critical care was counseled to the patient's overall poor prognosis. Peripheral access was attempted by them also unsuccessfully and a central line was being placed by them when patient went into ventricular arrhythmia and had a cardiac arrest. There is likely aspiration during the line placement or code but he also may have had aspiration prior to this with  his vomiting and altered mental status. Patient was given epinephrine, glucose bicarbonate calcium. He did not have return of circulation and time of death was 1234. Family was attempted be notified but there was no answer at the numbers. One message was left by critical care.    Benjiman Core, MD 2014-12-15 1341

## 2014-12-22 NOTE — ED Notes (Signed)
Pickering at bedside aware of current VS and pt status.

## 2014-12-22 NOTE — ED Notes (Signed)
Bed: FG18 Expected date:  Expected time:  Means of arrival:  Comments: Ems- n/v

## 2014-12-22 NOTE — Progress Notes (Signed)
Called all numbers in chart to discuss patients status with family.  No answer on any of the numbers.  Message left for Wa Rumpf to return a call to [facility] ER for an update.     Canary Brim, NP-C [facility] Pgr: [phone] or if no answer [phone] 11-Jan-2015, 12:49 PM

## 2014-12-22 NOTE — ED Notes (Signed)
Lab is aware that we can not get blood, they will be coming to get the blood soon

## 2014-12-22 NOTE — ED Notes (Addendum)
Pt vomited one time today. Pt has not eaten since yesterday. Pt has alzheimer and will answer questions and is baseline per the nursing facility. CBG 180, BP 112/62, HR 130, Resp 18. Marland Kitchen

## 2014-12-22 NOTE — ED Notes (Signed)
Brandon Wise is in the room getting blood

## 2014-12-22 NOTE — ED Notes (Signed)
Pt transported to CT; will complete additional orders with pt return.

## 2014-12-22 NOTE — ED Notes (Signed)
Critical care at bedside attempting central ine insertion d/t several unsuccessful IV attempts

## 2014-12-22 DEATH — deceased

## 2016-05-08 IMAGING — CR DG HUMERUS 2V *R*
2 series · 2 of 2 positions shown · non-contrast
Comparison: None.

CLINICAL DATA: Fall 3 days ago with pain, initial encounter

EXAM:
RIGHT HUMERUS - 2+ VIEW

[x humerus ap right (1 of 2)]
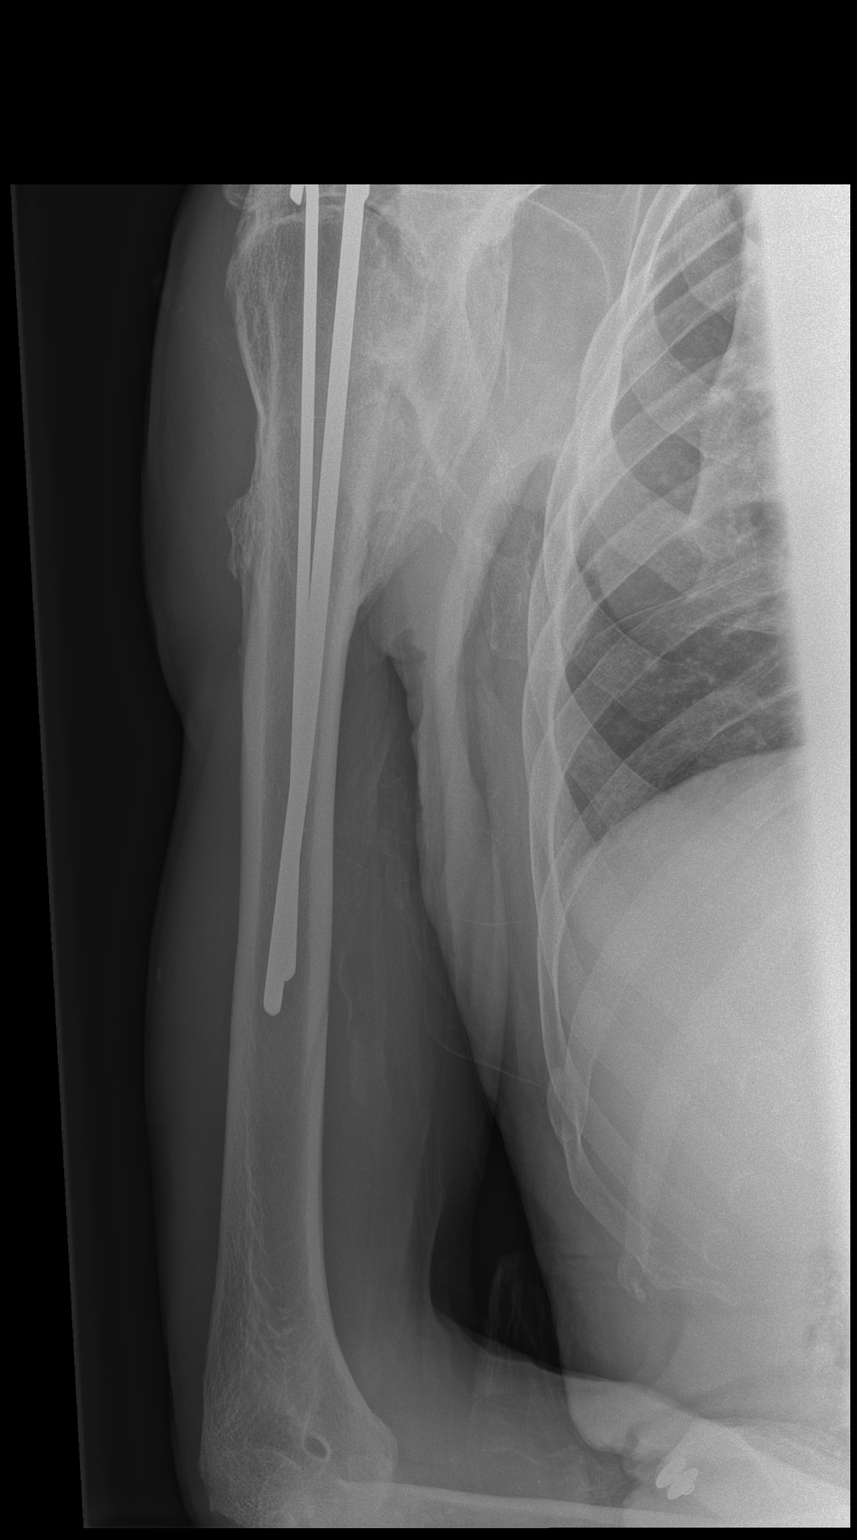

[x humerus ap right (2 of 2)]
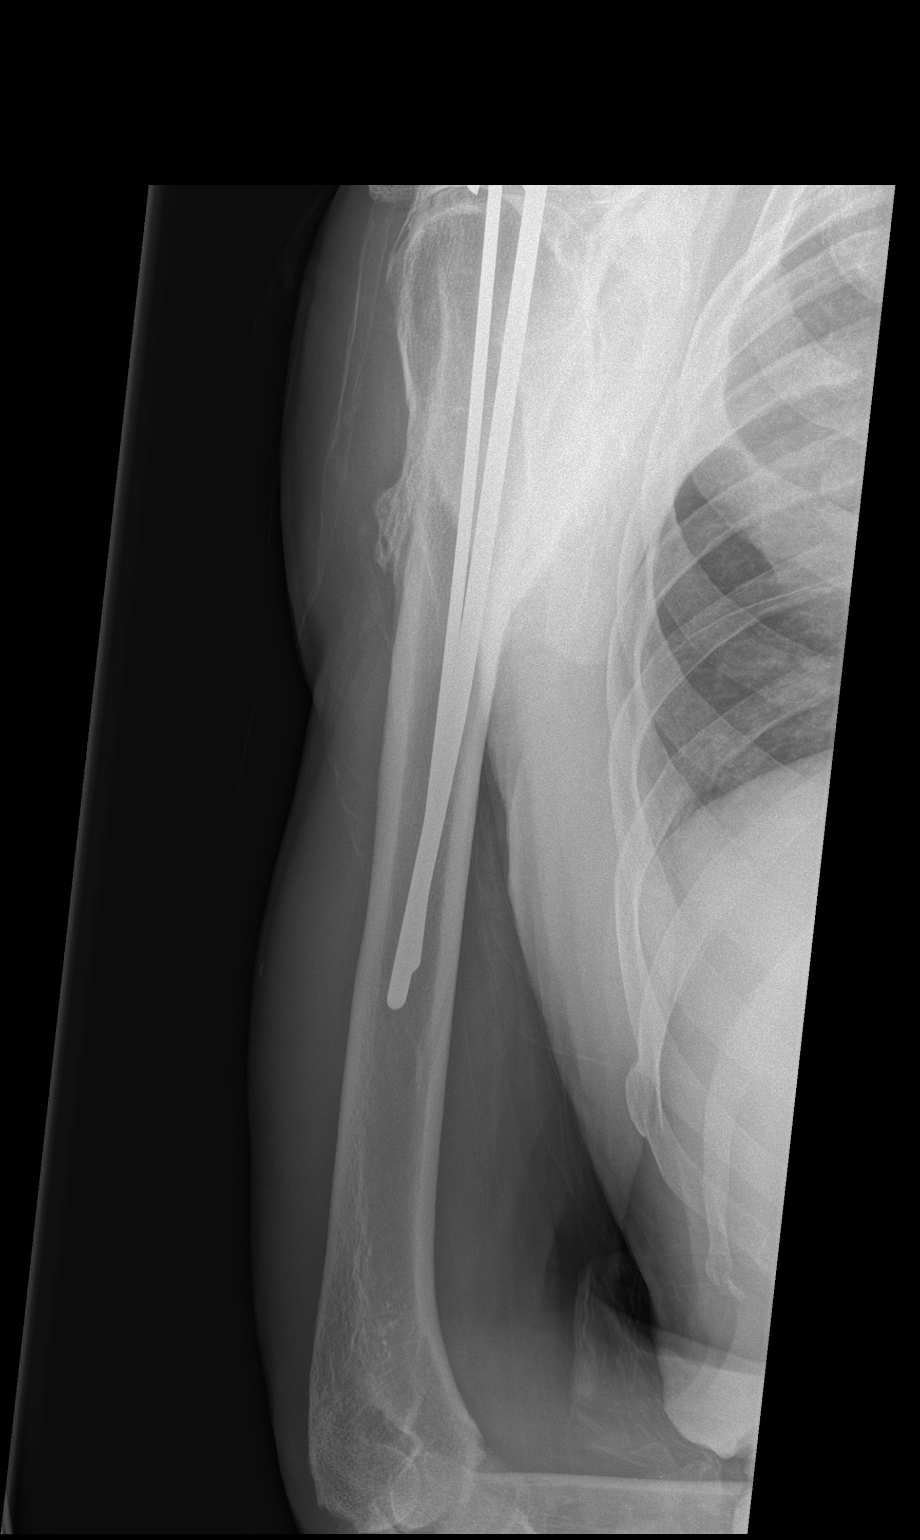

[2 of 2 positions shown; findings below may reference images not displayed]

FINDINGS: There again noted postsurgical changes in the proximal humerus with
dystrophic bone formation suggestive of frozen joint and fusion of
the glenohumeral articulation. Old rib fractures are noted on the
right with healing. No acute abnormality is seen.
IMPRESSION: Chronic changes about the shoulder joint without acute abnormality.

## 2016-05-19 IMAGING — CR DG CHEST 1V PORT
1 series · 1 of 1 positions shown · non-contrast
Comparison: 02/29/2012.

CLINICAL DATA: Initial encounter for vomiting since [DATE] this
morning.

EXAM:
PORTABLE CHEST - 1 VIEW

[AP]
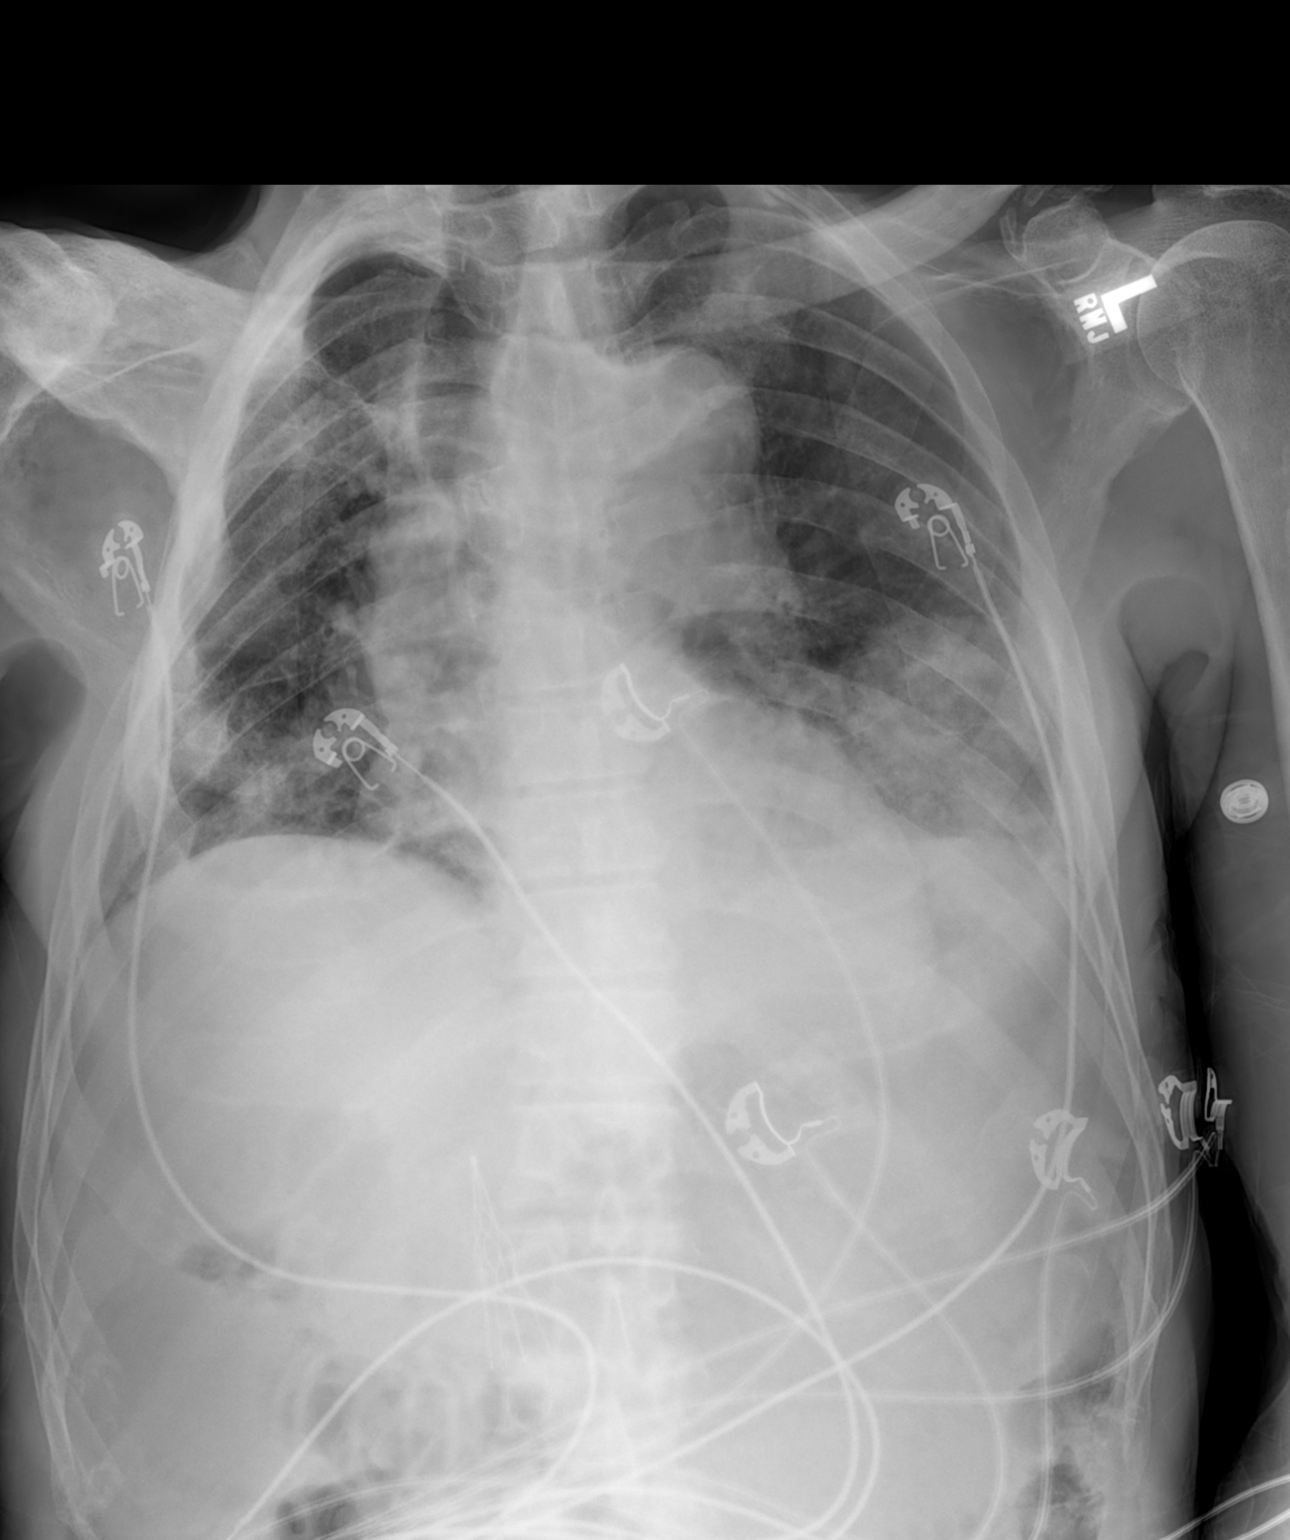

[1 of 1 positions shown; findings below may reference images not displayed]

FINDINGS: 8136 hrs. Patchy bilateral airspace disease is new in the interval,
left greater than right. The cardio pericardial silhouette is
enlarged. Chronic deformity in the right shoulder noted. Telemetry
leads overlie the chest. IVC filter overlies the right upper
quadrant of the abdomen.
IMPRESSION: Patchy bilateral airspace disease, left greater than right.
Multifocal pneumonia would be a consideration.
